# Patient Record
Sex: Female | Born: 1960 | Race: White | Hispanic: No | Marital: Married | Smoking: Never smoker
Health system: Southern US, Community
[De-identification: ages and names within clinical notes are randomized; demographics above are authoritative.]

---

## 1998-03-31 ENCOUNTER — Other Ambulatory Visit: Admission: RE | Admit: 1998-03-31 | Discharge: 1998-03-31 | Payer: Self-pay | Admitting: Obstetrics & Gynecology

## 2005-09-15 ENCOUNTER — Ambulatory Visit: Payer: Self-pay | Admitting: Internal Medicine

## 2005-09-16 ENCOUNTER — Ambulatory Visit: Payer: Self-pay | Admitting: Internal Medicine

## 2005-10-08 ENCOUNTER — Ambulatory Visit: Payer: Self-pay | Admitting: Surgery

## 2006-08-22 IMAGING — US ABDOMEN ULTRASOUND
1 series · 17 of 25 positions shown · non-contrast
Comparison: none

REASON FOR EXAM: Epigastric abdominal pain  CALL REPORT TO [phone]
COMMENTS:

[Series 1: abdomen ultrasound · 17 of 74 slices shown]
[im 1/74]
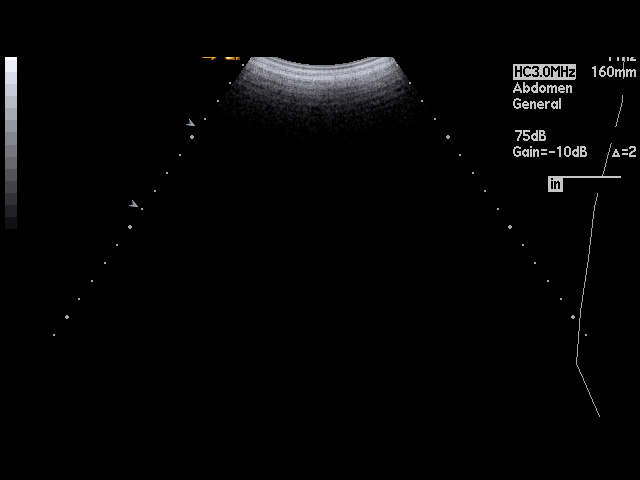
[im 7/74]
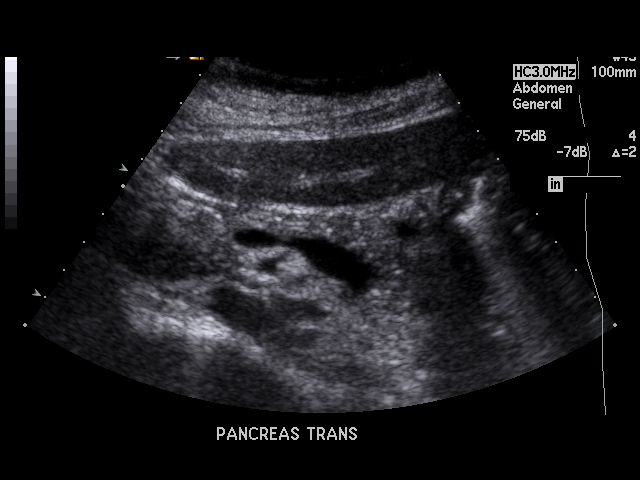
[im 10/74]
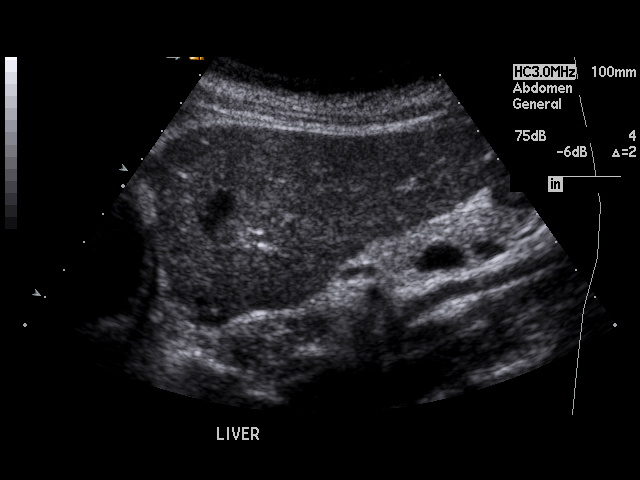
[im 16/74]
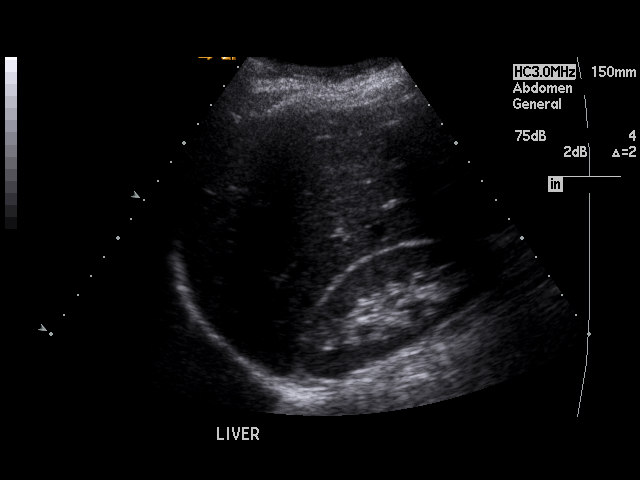
[im 19/74]
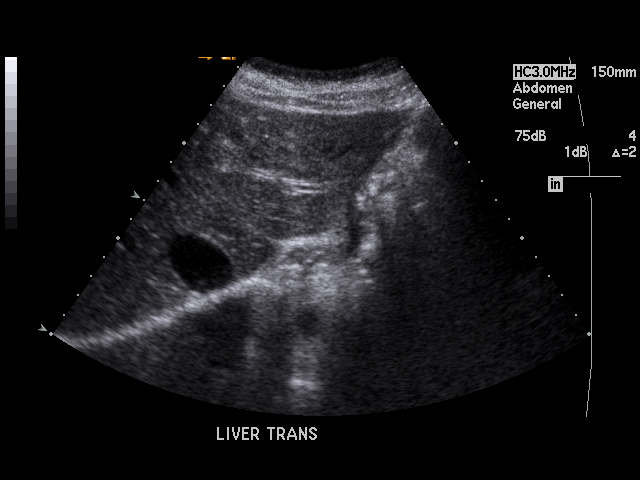
[im 25/74]
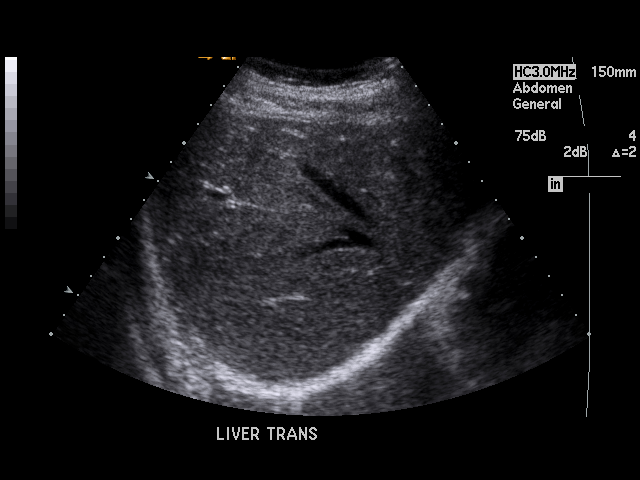
[im 28/74]
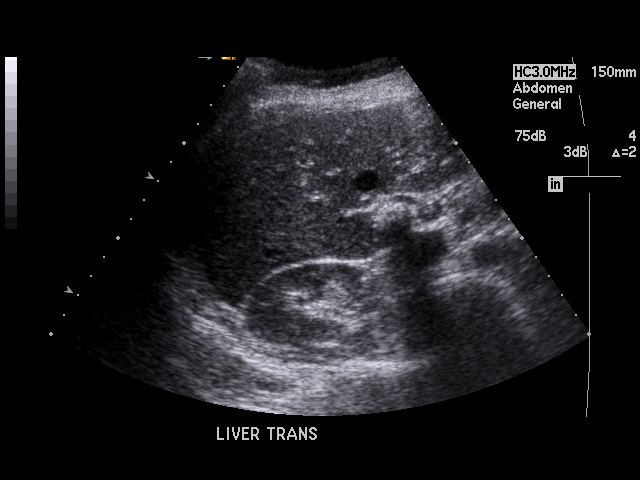
[im 34/74]
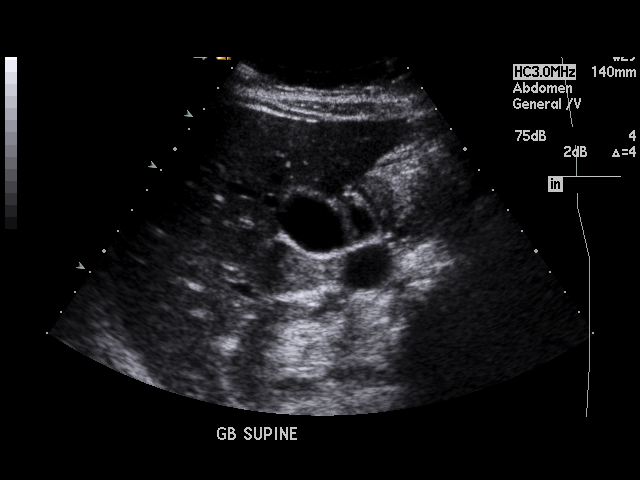
[im 37/74]
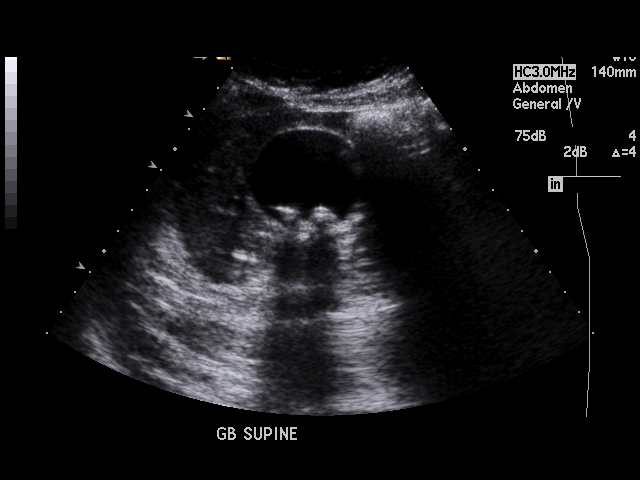
[im 40/74]
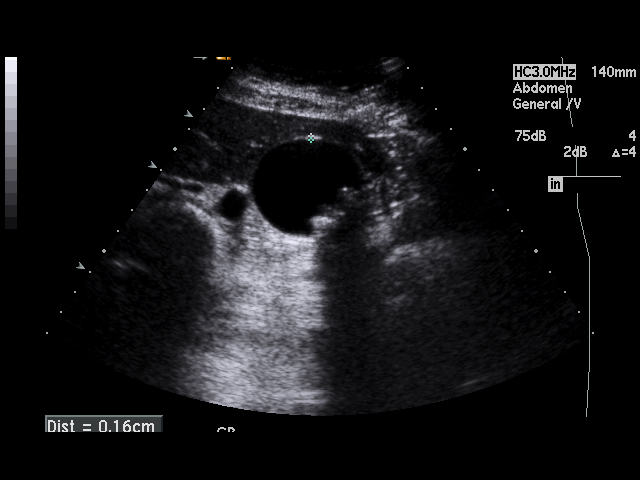
[im 46/74]
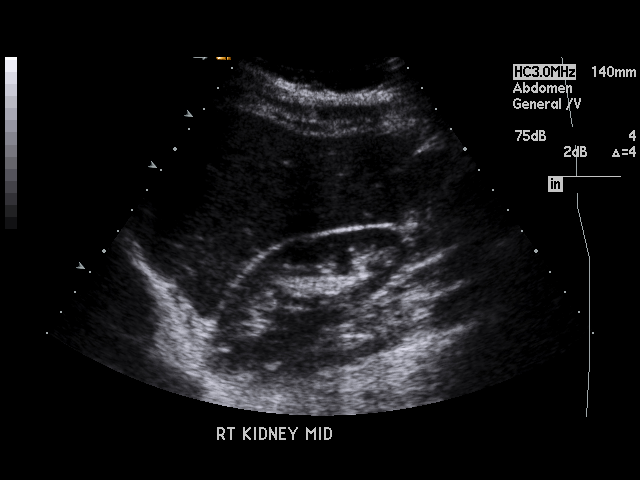
[im 49/74]
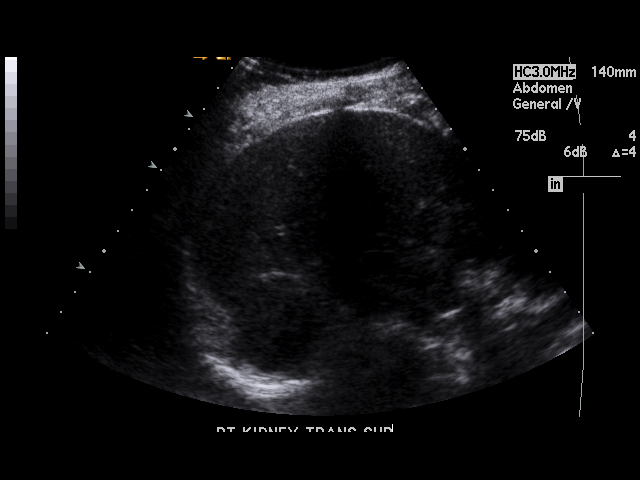
[im 55/74]
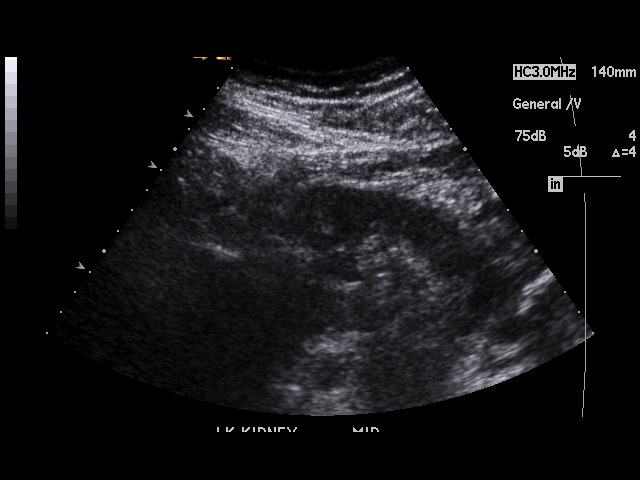
[im 58/74]
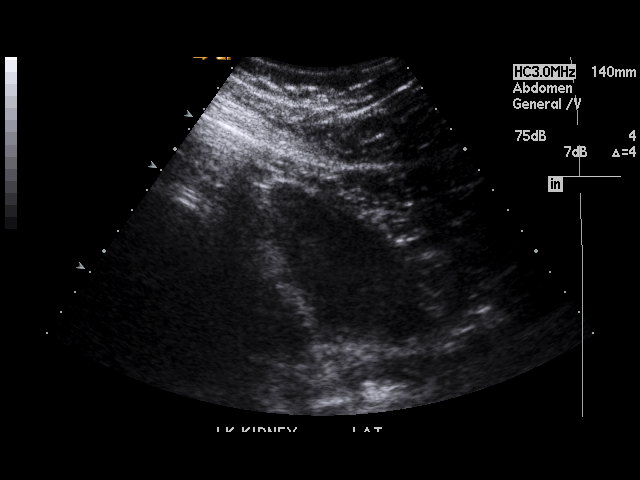
[im 64/74]
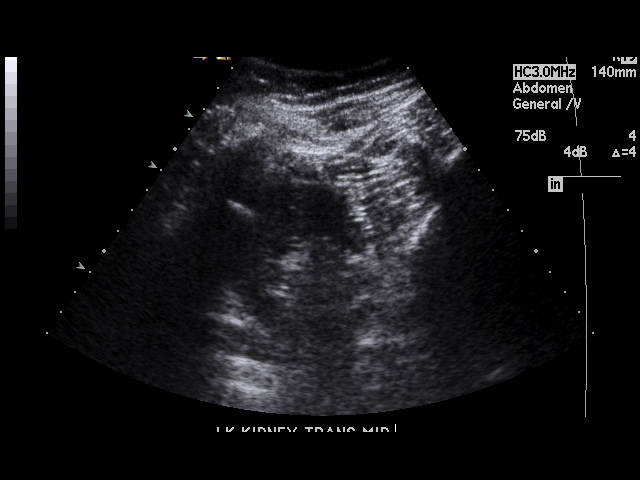
[im 67/74]
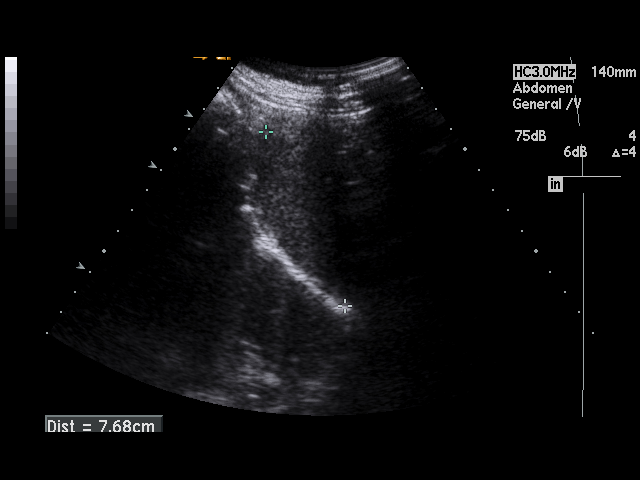
[im 74/74]
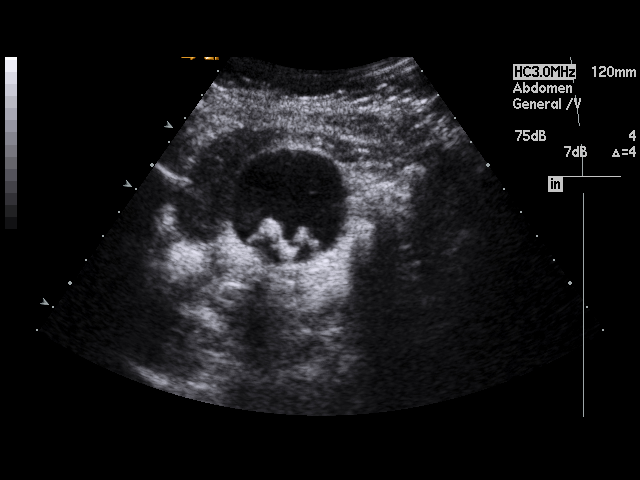

[17 of 25 positions shown; findings below may reference images not displayed]

PROCEDURE:     US  - US ABDOMEN GENERAL SURVEY  - September 16, 2005  [DATE]

RESULT:     The liver, spleen and pancreas are normal in appearance. The
abdominal aorta is visualized and shows no significant abnormalities.  There
are multiple echo densities in the gallbladder compatible with gallstones.
There is no thickening of the gallbladder wall.  The common bile duct
measures 5.7 mm in diameter which is within the range of normal.  The
kidneys show no hydronephrosis.  There is no ascites.
IMPRESSION: Cholelithiasis.

## 2009-07-22 ENCOUNTER — Emergency Department: Payer: Self-pay | Admitting: Emergency Medicine

## 2013-02-27 DIAGNOSIS — Z1211 Encounter for screening for malignant neoplasm of colon: Secondary | ICD-10-CM | POA: Insufficient documentation

## 2017-02-23 ENCOUNTER — Ambulatory Visit: Payer: Self-pay

## 2017-03-22 ENCOUNTER — Ambulatory Visit: Payer: Self-pay | Admitting: Dietician

## 2017-03-22 NOTE — Progress Notes (Signed)
Nutrition: Cassandra Herrera, RD, LDN  03/22/2017  CC: Wants to feel better, have more energy, feel stronger, and lose some weight.  Assessment:    Gives hx or having gained some weight and experienced loss of energy with onset of menopause.  Has done Weight Watchers in past, but finds that at this time it does not work for her.  Currently walks on campus and when shopping will park further from the buildings.  Works out at gym 3-4 times per week.  Combination cardio and resistance.  Diet recall reveals a late breakfast of coffee and yogurt or instant oatmeal with skim Almond milk.  Lunch is usually out, and is 1/2 sandwich and soup or salad.  Dinner is meat, potatoes and green veggie.  Followed with a dessert.  Tries to eat smaller portions, uses chicken and tries to use lower fat desserts.     HT: 62 inches  WT: 157 lbs.  Recommendations:   Try to add cardio to daily routine; 20 minutes daily   Use the interval approach of fast/slow for the short time.    Try to do whole grains.  Look for 3 gm of fiber per serving of breads and cereals.  Consider using the Old fashion oatmeal r/t the instant version.    Consider keeping a food diary for 2 weekdays and 1 weekend day.  This will help to get a handle on just what you are eating.    For fats such as cheese.  Keep the fat grams at 0-9 gm /serving.    Add more protein to evening meal in the form of beans, peas and lentils.    Monitor carb portions (espical potatoes), and desserts.  Short term Goals:    Feel better and have more enery.    Lower Cholesterol.    Get on a healthier track.  Follow-up:  Make appointment for July.

## 2017-04-25 ENCOUNTER — Ambulatory Visit: Payer: Self-pay

## 2017-04-26 ENCOUNTER — Ambulatory Visit: Payer: Self-pay

## 2017-04-26 ENCOUNTER — Ambulatory Visit: Payer: Self-pay | Admitting: Dietician

## 2017-04-26 NOTE — Progress Notes (Signed)
Nutrition F/U visit; Nutrition 05/27/2017  Reports that she has more energy and has been exercising at the gym on 4 out of 7 evenings.  Meets husband and she will do 30 minutes of cardio on the treadmill by walking and running and then follow this with about 30 minutes of resistance/weights.    Eating 3 meals per day.  Limiting bread and other carbs.  Trying to do more whole grains.  Trying to get more vegetables in diet.  Dinner is usually grill chicken or fish and leftovers if available.  Breakfast is usually oatmeal or yogurt and fruit.  Lunch maybe out with husband 3 days per week.  Usually 1/2 sandwich and soup or salad.  Takes one slice of bread off.  Weight today with clothes on is at 155.8.  1.2 lb weight loss.  Feels her clothes fit/feell better.  Pleased with her progress.  Recommendations: Continue with current exercise regimen. Continue to incorporated whole grains and carbs that have more fiber.  Continue to limit fatty meats and frying. Continue to limit bread servings. Watch energy levels.  Plan/Personal Goals: Continue what I'm doing in exercise. Continue healthier eating. Monitor clothes/fit/feel.  F/U:  September.  [facility]

## 2017-11-18 ENCOUNTER — Ambulatory Visit: Payer: Self-pay | Admitting: Adult Health

## 2017-11-18 ENCOUNTER — Encounter: Payer: Self-pay | Admitting: Adult Health

## 2017-11-18 VITALS — BP 117/72 | HR 83 | Temp 98.5°F | Resp 16 | Ht 61.0 in

## 2017-11-18 DIAGNOSIS — J019 Acute sinusitis, unspecified: Secondary | ICD-10-CM

## 2017-11-18 DIAGNOSIS — H1032 Unspecified acute conjunctivitis, left eye: Secondary | ICD-10-CM

## 2017-11-18 MED ORDER — GENTAMICIN SULFATE 0.3 % OP SOLN: 2.0000 [drp] | Freq: Three times a day (TID) | OPHTHALMIC | 0 refills | Status: DC

## 2017-11-18 MED ORDER — GENTAMICIN SULFATE 0.3 % OP SOLN: 2.0000 [drp] | OPHTHALMIC | 0 refills | Status: DC

## 2017-11-18 MED ORDER — AZITHROMYCIN 250 MG PO TABS: ORAL_TABLET | ORAL | 0 refills | Status: DC

## 2017-11-18 NOTE — Addendum Note (Signed)
Addended by: Berniece PapFLINCHUM, Cesilia Shinn S on: 11/18/2017 05:54 PM   Modules accepted: Orders

## 2017-11-18 NOTE — Patient Instructions (Signed)
Bacterial Conjunctivitis Bacterial conjunctivitis is an infection of your conjunctiva. This is the clear membrane that covers the white part of your eye and the inner surface of your eyelid. This condition can make your eye:  Red or pink.  Itchy.  This condition is caused by bacteria. This condition spreads very easily from person to person (is contagious) and from one eye to the other eye. Follow these instructions at home: Medicines  Take or apply your antibiotic medicine as told by your doctor. Do not stop taking or applying the antibiotic even if you start to feel better.  Take or apply over-the-counter and prescription medicines only as told by your doctor.  Do not touch your eyelid with the eye drop bottle or the ointment tube. Managing discomfort  Wipe any fluid from your eye with a warm, wet washcloth or a cotton ball.  Place a cool, clean washcloth on your eye. Do this for 10-20 minutes, 3-4 times per day. General instructions  Do not wear contact lenses until the irritation is gone. Wear glasses until your doctor says it is okay to wear contacts.  Do not wear eye makeup until your symptoms are gone. Throw away any old makeup.  Change or wash your pillowcase every day.  Do not share towels or washcloths with anyone.  Wash your hands often with soap and water. Use paper towels to dry your hands.  Do not touch or rub your eyes.  Do not drive or use heavy machinery if your vision is blurry. Contact a doctor if:  You have a fever.  Your symptoms do not get better after 10 days. Get help right away if:  You have a fever and your symptoms suddenly get worse.  You have very bad pain when you move your eye.  Your face: ? Hurts. ? Is red. ? Is swollen.  You have sudden loss of vision. This information is not intended to replace advice given to you by your health care provider. Make sure you discuss any questions you have with your health care provider. Document  Released: 06/29/2008 Document Revised: 02/26/2016 Document Reviewed: 07/03/2015 Elsevier Interactive Patient Education  2018 [employer]. Allergic Conjunctivitis A clear membrane (conjunctiva) covers the white part of your eye and the inner surface of your eyelid. Allergic conjunctivitis happens when this membrane has inflammation. This is caused by allergies. Common causes of allergic reactions (allergens)include:  Outdoor allergens, such as: ? Pollen. ? Grass and weeds. ? Mold spores.  Indoor allergens, such as: ? Dust. ? Smoke. ? Mold. ? Pet dander. ? Animal hair.  This condition can make your eye red or pink. It can also make your eye feel itchy. This condition cannot be spread from one person to another person (is not contagious). Follow these instructions at home:  Try not to be around things that you are allergic to.  Take or apply over-the-counter and prescription medicines only as told by your doctor. These include any eye drops.  Place a cool, clean washcloth on your eye for 10-20 minutes. Do this 3-4 times a day.  Do not touch or rub your eyes.  Do not wear contact lenses until the inflammation is gone. Wear glasses instead.  Do not wear eye makeup until the inflammation is gone.  Keep all follow-up visits as told by your doctor. This is important. Contact a doctor if:  Your symptoms get worse.  Your symptoms do not get better with treatment.  You have mild eye pain.  You  are sensitive to light,  You have spots or blisters on your eyes.  You have pus coming from your eye.  You have a fever. Get help right away if:  You have redness, swelling, or other symptoms in only one eye.  Your vision is blurry.  You have vision changes.  You have very bad eye pain. Summary  Allergic conjunctivitis is caused by allergies. It can make your eye red or pink, and it can make your eye feel itchy.  This condition cannot be spread from one person to another  person (is not contagious).  Try not to be around things that you are allergic to.  Take or apply over-the-counter and prescription medicines only as told by your doctor. These include any eye drops.  Contact your doctor if your symptoms get worse or they do not get better with treatment. This information is not intended to replace advice given to you by your health care provider. Make sure you discuss any questions you have with your health care provider. Document Released: 03/10/2010 Document Revised: 05/14/2016 Document Reviewed: 05/14/2016 Elsevier Interactive Patient Education  2017 [employer]

## 2017-11-18 NOTE — Progress Notes (Addendum)
Patient ID: Cassandra Herrera, female   DOB: May 10, 1961, 57 y.o.   MRN: [id number] 11/18/17  Patient called to say z-pack was received  by pharmacy but they did not receive eye drops. Prpovider resent as above and discontinued 1st prescription. She had called [name] to inform after hours and [name] will notify patinet it has been resent   Meds ordered this encounter  Medications  . azithromycin (ZITHROMAX) 250 MG tablet    Sig: Take 2 tablets ( 500 mg) day one, then take 1 tablet (250mg ) days 2,3,4,5    Dispense:  6 tablet    Refill:  0  .     .     . gentamicin (GARAMYCIN) 0.3 % ophthalmic solution    Sig: Place 2 drops into the left eye 3 (three) times daily. For 5 to 7 days    Dispense:  5 mL    Refill:  0  Strike through not ordered/ Gentamycin ophthalmic  In red ordered as directly above.   [facility] as was on hold for 15 minutes without response.

## 2017-11-18 NOTE — Progress Notes (Addendum)
Subjective:     Patient ID: Cassandra Herrera, female   DOB: 1960/11/16, 57 y.o.   MRN: [id number]  Conjunctivitis   The current episode started 2 days ago. The onset was gradual. The problem occurs continuously. The problem has been unchanged. The problem is mild. Nothing relieves the symptoms. Associated symptoms include eye itching (mild ), congestion, rhinorrhea, eye discharge (left eye yellow discharge ) and eye redness. Pertinent negatives include no orthopnea, no fever, no decreased vision, no double vision, no photophobia, no abdominal pain, no constipation, no diarrhea, no nausea, no vomiting, no ear discharge, no ear pain, no headaches, no hearing loss, no mouth sores, no sore throat, no stridor, no swollen glands, no muscle aches, no neck pain, no neck stiffness, no cough, no URI, no wheezing, no rash and no eye pain. Severity: no pain    Patient refused weight.  She denies any eye injury, eye pain or any visual disturbance. Denies any fever. Denies any contact with foreign body. Denies contact lens use.  She alsop reports nasal drainage and sinus pressure left maxillary. Denies any other symptoms.   Patient  denies any fever, chills, rash, chest pain, shortness of breath, nausea, vomiting, or diarrhea.   Vision exam is 20/30 bilateral eyes without glasses.   Blood pressure 117/72, pulse 83, temperature 98.5 F (36.9 C), resp. rate 16, height 5\' 1"  (1.549 m), SpO2 100 %.  There are no active problems to display for this patient.  No current outpatient medications on file.\ Patient denies any medication/ or problems.   Review of Systems  Constitutional: Negative for activity change, appetite change, chills, diaphoresis, fatigue, fever and unexpected weight change.  HENT: Positive for congestion, postnasal drip, rhinorrhea and sinus pressure (left maxillary ). Negative for dental problem, drooling, ear discharge, ear pain, facial swelling, hearing loss, mouth sores, nosebleeds, sinus  pain, sneezing, sore throat, tinnitus, trouble swallowing and voice change.   Eyes: Positive for discharge (left eye yellow discharge ), redness and itching (mild ). Negative for double vision, photophobia and pain.  Respiratory: Negative for apnea, cough, choking, chest tightness, shortness of breath, wheezing and stridor.   Cardiovascular: Negative.  Negative for chest pain, palpitations, orthopnea and leg swelling.  Gastrointestinal: Negative.  Negative for abdominal distention, abdominal pain, anal bleeding, blood in stool, constipation, diarrhea, nausea, rectal pain and vomiting.  Genitourinary: Negative.   Musculoskeletal: Negative.  Negative for neck pain.  Skin: Negative.  Negative for rash.  Neurological: Negative.  Negative for headaches.  Hematological: Negative.   Psychiatric/Behavioral: Negative.        Objective:   Physical Exam  Constitutional: She is oriented to person, place, and time. She appears well-developed and well-nourished. No distress.  HENT:  Head: Normocephalic and atraumatic.  Right Ear: Hearing, external ear and ear canal normal. Tympanic membrane is not perforated, not erythematous and not retracted. A middle ear effusion is present.  Left Ear: Hearing, external ear and ear canal normal. Tympanic membrane is not perforated, not erythematous and not retracted. A middle ear effusion is present.  Nose: Mucosal edema and rhinorrhea present. Right sinus exhibits no maxillary sinus tenderness and no frontal sinus tenderness. Left sinus exhibits maxillary sinus tenderness. Left sinus exhibits no frontal sinus tenderness.  Mouth/Throat: Uvula is midline. Posterior oropharyngeal erythema (mild ) present. No oropharyngeal exudate, posterior oropharyngeal edema or tonsillar abscesses.  Eyes: EOM are normal. Pupils are equal, round, and reactive to light. Right eye exhibits no chemosis, no discharge, no exudate and no hordeolum.  No foreign body present in the right eye. Left  eye exhibits discharge (yellow discharge on lower lid and upper eyelid/ mild generalized pinkness to conjunctiva. NO pain. NO visual disturbances. ). Left eye exhibits no chemosis, no exudate and no hordeolum. No foreign body present in the left eye. Right conjunctiva is not injected. Right conjunctiva has no hemorrhage. Left conjunctiva is injected (mild pink discoloration to left conjunctiva ). Left conjunctiva has no hemorrhage. No scleral icterus. Right eye exhibits normal extraocular motion and no nystagmus. Left eye exhibits normal extraocular motion and no nystagmus. Right pupil is round and reactive. Left pupil is round and reactive. Pupils are equal.  No pain or boney tenderness around periorbital. No erythema of skin.   Neck: Normal range of motion. Neck supple. No JVD present. No tracheal deviation present.  Cardiovascular: Normal rate, regular rhythm, normal heart sounds and intact distal pulses. Exam reveals no gallop and no friction rub.  No murmur heard. Pulmonary/Chest: Effort normal and breath sounds normal. No stridor. No respiratory distress. She has no wheezes. She has no rales. She exhibits no tenderness.  Abdominal: Soft. Bowel sounds are normal.  Musculoskeletal: Normal range of motion.  Lymphadenopathy:    She has no cervical adenopathy.  Neurological: She is alert and oriented to person, place, and time. She displays normal reflexes. No cranial nerve deficit. She exhibits normal muscle tone. Coordination normal.  Patient moves on and off of exam table and in room without difficulty. Gait is normal in hall and in room. Patient is oriented to person place time and situation. Patient answers questions appropriately and engages in conversation.   Skin: Skin is warm and dry. No rash noted. She is not diaphoretic. No erythema. No pallor.  Psychiatric: She has a normal mood and affect. Her behavior is normal. Judgment and thought content normal.  Vitals reviewed.      Assessment:     Acute conjunctivitis of left eye, unspecified acute conjunctivitis type  Acute non-recurrent sinusitis, unspecified location       Plan:     Meds ordered this encounter  Medications  . azithromycin (ZITHROMAX) 250 MG tablet    Sig: Take 2 tablets ( 500 mg) day one, then take 1 tablet (250mg ) days 2,3,4,5    Dispense:  6 tablet    Refill:  0  . gentamicin (GARAMYCIN) 0.3 % ophthalmic solution    Sig: Place 2 drops into the left eye 3 (three) times daily for 5 days.    Dispense:  5 mL    Refill:  0    She is educated that if any eye pain, increased eye redness, visual disturbances or any worsening symptoms to be seen in urgent care/ Emergency room. She is advised no symptoms should get worse.  Also advised to start Claritin 10 mg daily to rule out allergy component.   Advised to return to clinic for an appointment if no improvement within 72 hours or if any symptoms change or worsen. Advised ER or urgent Care if after hours or on weekend. 911 for emergency symptoms at any time.   Patient verbalized understanding of all instructions given and denies any further questions at this time.

## 2018-07-26 ENCOUNTER — Other Ambulatory Visit: Payer: Self-pay

## 2018-07-26 DIAGNOSIS — Z130 Encounter for screening for diseases of the blood and blood-forming organs and certain disorders involving the immune mechanism: Secondary | ICD-10-CM

## 2018-07-27 ENCOUNTER — Other Ambulatory Visit: Payer: Self-pay

## 2018-09-21 ENCOUNTER — Encounter: Payer: Self-pay | Admitting: Adult Health

## 2018-09-21 ENCOUNTER — Ambulatory Visit: Payer: Self-pay | Admitting: Adult Health

## 2018-09-21 VITALS — BP 130/70 | HR 71 | Temp 98.4°F | Resp 16

## 2018-09-21 DIAGNOSIS — J039 Acute tonsillitis, unspecified: Principal | ICD-10-CM

## 2018-09-21 DIAGNOSIS — J029 Acute pharyngitis, unspecified: Secondary | ICD-10-CM | POA: Insufficient documentation

## 2018-09-21 MED ORDER — CLINDAMYCIN HCL 150 MG PO CAPS: 150.0000 mg | ORAL_CAPSULE | Freq: Three times a day (TID) | ORAL | 0 refills | Status: AC

## 2018-09-21 NOTE — Progress Notes (Signed)
Subjective:     Patient ID: Cassandra Herrera, female   DOB: [birth year], 57 y.o.   MRN: [id number]  HPI  Blood pressure 130/70, pulse 71, temperature 98.4 F (36.9 C), resp. rate 16, SpO2 100 %.  Patient is a 57 year old female in no acute distress who comes to the clinic with complaint of on tonsil on right side.  Onset on 09/19/18.  Patient  denies any fever, body aches,chills, rash, chest pain, shortness of breath, nausea, vomiting, or diarrhea.        No LMP recorded. Menopause.   Allergies  Allergen Reactions  . Penicillins Hives    Patient Active Problem List   Diagnosis Date Noted  . Colon cancer screening 02/27/2013    No current outpatient medications on file.   Review of Systems  Constitutional: Positive for fatigue. Negative for activity change, appetite change, chills, diaphoresis, fever and unexpected weight change.  HENT: Positive for sore throat. Negative for congestion, dental problem, drooling, ear discharge, ear pain (right ear pressure. ), facial swelling, hearing loss, mouth sores, nosebleeds, postnasal drip, rhinorrhea, sinus pressure, sinus pain, sneezing, tinnitus, trouble swallowing and voice change.   Respiratory: Negative.   Cardiovascular: Negative.   Gastrointestinal: Negative.   Genitourinary: Negative.   Musculoskeletal: Negative.   Skin: Negative.   Allergic/Immunologic:        -- Penicillins -- Hives   Neurological: Negative.   Hematological: Negative.   Psychiatric/Behavioral: Negative.        Objective:   Physical Exam Vitals signs reviewed.  Constitutional:      General: She is not in acute distress.    Appearance: She is well-developed. She is not ill-appearing, toxic-appearing or diaphoretic.  HENT:     Head: Normocephalic and atraumatic.     Jaw: There is normal jaw occlusion.     Salivary Glands: Right salivary gland is not diffusely enlarged or tender. Left salivary gland is not diffusely enlarged or tender.     Right  Ear: Tympanic membrane, ear canal and external ear normal. No drainage, swelling or tenderness. No middle ear effusion. Tympanic membrane is not erythematous.     Left Ear: Tympanic membrane, ear canal and external ear normal. No drainage, swelling or tenderness.  No middle ear effusion. Tympanic membrane is not erythematous.     Nose: Nose normal. No congestion or rhinorrhea.     Right Sinus: No maxillary sinus tenderness or frontal sinus tenderness.     Left Sinus: No maxillary sinus tenderness or frontal sinus tenderness.     Mouth/Throat:     Mouth: Mucous membranes are moist. Mucous membranes are pale. No oral lesions.     Pharynx: Uvula midline. Oropharyngeal exudate and posterior oropharyngeal erythema present. No pharyngeal swelling or uvula swelling.     Tonsils: Tonsillar exudate (white ) present. No tonsillar abscesses. Swelling: 1+ on the right. 1+ on the left.   Eyes:     General: Lids are normal.     Extraocular Movements:     Right eye: Normal extraocular motion.     Left eye: Normal extraocular motion.     Conjunctiva/sclera: Conjunctivae normal.     Pupils: Pupils are equal, round, and reactive to light.  Neck:     Musculoskeletal: Normal range of motion and neck supple.     Trachea: Trachea and phonation normal.     Meningeal: Brudzinski's sign absent.  Cardiovascular:     Rate and Rhythm: Normal rate and regular rhythm.  Heart sounds: Normal heart sounds. No murmur. No friction rub. No gallop.   Pulmonary:     Effort: Pulmonary effort is normal.     Breath sounds: Normal breath sounds.  Abdominal:     Palpations: Abdomen is soft.  Lymphadenopathy:     Head:     Right side of head: No submental, submandibular, tonsillar, preauricular, posterior auricular or occipital adenopathy.     Left side of head: No submental, submandibular, tonsillar, preauricular, posterior auricular or occipital adenopathy.     Cervical: No cervical adenopathy.  Skin:    General: Skin  is warm and dry.     Capillary Refill: Capillary refill takes less than 2 seconds.     Nails: There is no clubbing.   Neurological:     Mental Status: She is alert.  Psychiatric:        Attention and Perception: Attention normal.        Mood and Affect: Mood normal.        Speech: Speech normal.        Behavior: Behavior normal. Behavior is cooperative.        Thought Content: Thought content normal.        Cognition and Memory: Cognition normal.        Judgment: Judgment normal.        Assessment:       Tonsillopharyngitis   Plan:   Meds ordered this encounter  Medications  . clindamycin (CLEOCIN) 150 MG capsule    Sig: Take 1 capsule (150 mg total) by mouth 3 (three) times daily for 10 days.    Dispense:  30 capsule    Refill:  0    Gave and reviewed After Visit Summary(AVS) with patient.  Seek medical care as educated below.     Advised patient call the office or your primary care doctor for an appointment if no improvement within 72 hours or if any symptoms change or worsen at any time  Advised ER or urgent Care if after hours or on weekend. Call 911 for emergency symptoms at any time.Patinet verbalized understanding of all instructions given/reviewed and treatment plan and has no further questions or concerns at this time.    Patient verbalized understanding of all instructions given and denies any further questions at this time.

## 2018-09-21 NOTE — Patient Instructions (Signed)
Clindamycin capsules What is this medicine? CLINDAMYCIN (Hobson sin) is a lincosamide antibiotic. It is used to treat certain kinds of bacterial infections. It will not work for colds, flu, or other viral infections. This medicine may be used for other purposes; ask your health care provider or pharmacist if you have questions. COMMON BRAND NAME(S): Cleocin What should I tell my health care provider before I take this medicine? They need to know if you have any of these conditions: -kidney disease -liver disease -stomach problems like colitis -an unusual or allergic reaction to clindamycin, lincomycin, or other medicines, foods, dyes like tartrazine or preservatives -pregnant or trying to get pregnant -breast-feeding How should I use this medicine? Take this medicine by mouth with a full glass of water. Follow the directions on the prescription label. You can take this medicine with food or on an empty stomach. If the medicine upsets your stomach, take it with food. Take your medicine at regular intervals. Do not take your medicine more often than directed. Take all of your medicine as directed even if you think your are better. Do not skip doses or stop your medicine early. Talk to your pediatrician regarding the use of this medicine in children. Special care may be needed. Overdosage: If you think you have taken too much of this medicine contact a poison control center or emergency room at once. NOTE: This medicine is only for you. Do not share this medicine with others. What if I miss a dose? If you miss a dose, take it as soon as you can. If it is almost time for your next dose, take only that dose. Do not take double or extra doses. What may interact with this medicine? -birth control pills -erythromycin -medicines that relax muscles for surgery -rifampin This list may not describe all possible interactions. Give your health care provider a list of all the medicines, herbs,  non-prescription drugs, or dietary supplements you use. Also tell them if you smoke, drink alcohol, or use illegal drugs. Some items may interact with your medicine. What should I watch for while using this medicine? Tell your doctor or healthcare professional if your symptoms do not start to get better or if they get worse. Do not treat diarrhea with over the counter products. Contact your doctor if you have diarrhea that lasts more than 2 days or if it is severe and watery. What side effects may I notice from receiving this medicine? Side effects that you should report to your doctor or health care professional as soon as possible: -allergic reactions like skin rash, itching or hives, swelling of the face, lips, or tongue -dark urine -pain on swallowing -redness, blistering, peeling or loosening of the skin, including inside the mouth -unusual bleeding or bruising -unusually weak or tired -yellowing of eyes or skin Side effects that usually do not require medical attention (report to your doctor or health care professional if they continue or are bothersome): -diarrhea -itching in the rectal or genital area -joint pain -nausea, vomiting -stomach pain This list may not describe all possible side effects. Call your doctor for medical advice about side effects. You may report side effects to FDA at 1-800-FDA-1088. Where should I keep my medicine? Keep out of the reach of children. Store at room temperature between 20 and 25 degrees C (68 and 77 degrees F). Throw away any unused medicine after the expiration date. NOTE: This sheet is a summary. It may not cover all possible information. If you  have questions about this medicine, talk to your doctor, pharmacist, or health care provider.  2019 Elsevier/Gold Standard (2015-12-24 16:34:00) Pharyngitis  Pharyngitis is a sore throat (pharynx). This is when there is redness, pain, and swelling in your throat. Most of the time, this condition gets  better on its own. In some cases, you may need medicine. Follow these instructions at home:  Take over-the-counter and prescription medicines only as told by your doctor. ? If you were prescribed an antibiotic medicine, take it as told by your doctor. Do not stop taking the antibiotic even if you start to feel better. ? Do not give children aspirin. Aspirin has been linked to Reye syndrome.  Drink enough water and fluids to keep your pee (urine) clear or pale yellow.  Get a lot of rest.  Rinse your mouth (gargle) with a salt-water mixture 3-4 times a day or as needed. To make a salt-water mixture, completely dissolve -1 tsp of salt in 1 cup of warm water.  If your doctor approves, you may use throat lozenges or sprays to soothe your throat. Contact a doctor if:  You have large, tender lumps in your neck.  You have a rash.  You cough up green, yellow-brown, or bloody spit. Get help right away if:  You have a stiff neck.  You drool or cannot swallow liquids.  You cannot drink or take medicines without throwing up.  You have very bad pain that does not go away with medicine.  You have problems breathing, and it is not from a stuffy nose.  You have new pain and swelling in your knees, ankles, wrists, or elbows. Summary  Pharyngitis is a sore throat (pharynx). This is when there is redness, pain, and swelling in your throat.  If you were prescribed an antibiotic medicine, take it as told by your doctor. Do not stop taking the antibiotic even if you start to feel better.  Most of the time, pharyngitis gets better on its own. Sometimes, you may need medicine. This information is not intended to replace advice given to you by your health care provider. Make sure you discuss any questions you have with your health care provider. Document Released: 03/08/2008 Document Revised: 10/26/2016 Document Reviewed: 10/26/2016 Elsevier Interactive Patient Education  2019 [employer]

## 2019-07-18 ENCOUNTER — Ambulatory Visit: Payer: Self-pay

## 2019-07-18 ENCOUNTER — Other Ambulatory Visit: Payer: Self-pay

## 2019-07-18 DIAGNOSIS — Z23 Encounter for immunization: Secondary | ICD-10-CM

## 2019-07-30 ENCOUNTER — Other Ambulatory Visit: Payer: Self-pay

## 2019-07-30 DIAGNOSIS — Z20822 Contact with and (suspected) exposure to covid-19: Secondary | ICD-10-CM

## 2019-08-01 LAB — NOVEL CORONAVIRUS, NAA: SARS-CoV-2, NAA: NOT DETECTED

## 2020-10-27 ENCOUNTER — Encounter: Payer: BC Managed Care – PPO | Admitting: Dermatology

## 2021-02-17 ENCOUNTER — Other Ambulatory Visit: Payer: Self-pay

## 2021-02-17 ENCOUNTER — Telehealth: Payer: BC Managed Care – PPO | Admitting: Medical

## 2021-02-17 ENCOUNTER — Ambulatory Visit: Payer: BC Managed Care – PPO

## 2021-02-17 DIAGNOSIS — R509 Fever, unspecified: Secondary | ICD-10-CM

## 2021-02-17 DIAGNOSIS — R6889 Other general symptoms and signs: Secondary | ICD-10-CM

## 2021-02-17 DIAGNOSIS — R059 Cough, unspecified: Secondary | ICD-10-CM

## 2021-02-17 DIAGNOSIS — Z20822 Contact with and (suspected) exposure to covid-19: Secondary | ICD-10-CM

## 2021-02-17 DIAGNOSIS — J3489 Other specified disorders of nose and nasal sinuses: Secondary | ICD-10-CM

## 2021-02-17 LAB — POC COVID19 BINAXNOW: SARS Coronavirus 2 Ag: NEGATIVE

## 2021-02-17 LAB — POCT INFLUENZA A/B
Influenza A, POC: NEGATIVE
Influenza B, POC: NEGATIVE

## 2021-02-17 NOTE — Progress Notes (Signed)
   Subjective:    Patient ID: Cassandra Herrera, female    DOB: [birth year], 60 y.o.   MRN: [id number]  HPI 60 yo female in non acute distress consents to telemedicine appointment.Suday night with coughing with sym[ptoms wsorsening, congestion, fever, Denies loss of taste or smell. Using OTC Delsym and Tylenol for symptom control. Slept well last night.  O2 96%  Review of Systems  Constitutional: Positive for appetite change (decreased food , but drinking), chills, fatigue and fever.  HENT: Positive for congestion, postnasal drip and rhinorrhea (clear). Negative for sinus pressure, sinus pain, sneezing and sore throat.   Respiratory: Positive for cough (clear) and chest tightness. Negative for shortness of breath.   Cardiovascular: Negative for chest pain.  Gastrointestinal: Negative for abdominal pain and diarrhea.       Stomach bug last week x 2 days, with diarrhea, no N/V  Musculoskeletal: Negative for myalgias.  Skin: Negative for color change and rash.  Allergic/Immunologic: Negative for environmental allergies and food allergies.  Neurological: Negative for dizziness, syncope, light-headedness and headaches.       Objective:   Physical Exam AXOX3 No distress noted No physical exam performed due to telemedicine appointment.    A cough noted on call Results for orders placed or performed in visit on 02/17/21 (from the past 24 hour(s))  POC COVID-19     Status: Normal   Collection Time: 02/17/21  9:06 AM  Result Value Ref Range   SARS Coronavirus 2 Ag Negative Negative  POCT Influenza A/B     Status: Normal   Collection Time: 02/17/21  9:07 AM  Result Value Ref Range   Influenza A, POC Negative Negative   Influenza B, POC Negative Negative      Assessment & Plan:  Covid-19 screening. Cough Nasal congestion and rhinorrhea Isolate, rest, increase fluids, OTC Motrin or tylenol if not feeling well or for fever per package instructions. Covid-19 PCR Pending will treat as  positive due to symptoms. RTW date is  02/23/21. If worsening patient to call the clinic . She verbalizes understanding and has no questions at discharge.

## 2021-02-17 NOTE — Patient Instructions (Signed)

## 2021-02-18 LAB — NOVEL CORONAVIRUS, NAA: SARS-CoV-2, NAA: DETECTED — AB

## 2021-02-18 LAB — SARS-COV-2, NAA 2 DAY TAT

## 2021-02-19 ENCOUNTER — Telehealth: Payer: BC Managed Care – PPO | Admitting: Nurse Practitioner

## 2021-02-19 ENCOUNTER — Other Ambulatory Visit: Payer: Self-pay

## 2021-02-19 DIAGNOSIS — U071 COVID-19: Secondary | ICD-10-CM

## 2021-02-19 NOTE — Progress Notes (Signed)
Patient was seen on office on 02/17/21 tested negative for COVID-19 on rapid  Today PCR returned positive   After visit at [facility] she was seen at her PCPs office where she tested negative for flu as well  She was started on ZPack and Predisone at that time.   Patient is improving today  Slight productive cough that is improving  She has been running a fever but that stopped as of yesterday.   She has been vaccinated without a booster for COVID-19   She denies a history of HTN asthma or respiratory disease.   Advised patient that quarantine will extend through today, then she will be able to return to activities with a mask for 5 additional days. Wednesday of next week 02/25/21 she may return to activities without a mask  She will also reach out to PCP to discuss possible antivirals  And will f/u if needed if symptoms persist or with new concerns  Recent Results (from the past 2160 hour(s))  Novel Coronavirus, NAA ([facility])     Status: Abnormal   Collection Time: 02/17/21  8:30 AM   Specimen: Nasopharyngeal(NP) swabs in vial transport medium   Nasopharynge  Result Value Ref Range   SARS-CoV-2, NAA Detected (A) Not Detected    Comment: Patients who have a positive COVID-19 test result may now have treatment options. Treatment options are available for patients with mild to moderate symptoms and for hospitalized patients. Visit our website at [email or web address] for resources and information. This nucleic acid amplification test was developed and its performance characteristics determined by [employer]. Nucleic acid amplification tests include RT-PCR and TMA. This test has not been FDA cleared or approved. This test has been authorized by FDA under an Emergency Use Authorization (EUA). This test is only authorized for the duration of time the declaration that circumstances exist justifying the authorization of the emergency use of in vitro diagnostic tests for  detection of SARS-CoV-2 virus and/or diagnosis of COVID-19 infection under section 564(b)(1) of the Act, 21 U.S.C. 818HUD-1(S) (1), unless the authorization is terminated or revoked sooner. When diagnostic testing is negativ e, the possibility of a false negative result should be considered in the context of a patient's recent exposures and the presence of clinical signs and symptoms consistent with COVID-19. An individual without symptoms of COVID-19 and who is not shedding SARS-CoV-2 virus would expect to have a negative (not detected) result in this assay.   SARS-COV-2, NAA 2 DAY TAT     Status: None   Collection Time: 02/17/21  8:30 AM   Nasopharynge  Result Value Ref Range   SARS-CoV-2, NAA 2 DAY TAT Performed   POC COVID-19     Status: Normal   Collection Time: 02/17/21  9:06 AM  Result Value Ref Range   SARS Coronavirus 2 Ag Negative Negative    Comment: Patient aware of negative result. Virtual f/u with H Ratcliffe  POCT Influenza A/B     Status: Normal   Collection Time: 02/17/21  9:07 AM  Result Value Ref Range   Influenza A, POC Negative Negative   Influenza B, POC Negative Negative

## 2021-06-01 ENCOUNTER — Ambulatory Visit: Payer: BC Managed Care – PPO | Admitting: Medical

## 2021-06-01 ENCOUNTER — Other Ambulatory Visit: Payer: Self-pay

## 2021-06-01 DIAGNOSIS — R059 Cough, unspecified: Secondary | ICD-10-CM

## 2021-06-01 NOTE — Progress Notes (Signed)
   Subjective:    Patient ID: Cassandra Herrera, female    DOB: [birth year], 60 y.o.   MRN: [id number]  HPI 60 yo female in non acute distress consents to telemedicine appointment. Sick on Thursday and Friday with cough  and cold like symptoms. Covid-19 at home was negative at this point she had 2-3 days of symptoms. Nasal congestions. Still coughing on occasion it is light yellow.   Allergies  Allergen Reactions   Penicillins Hives     Review of Systems  Constitutional:  Negative for chills and fever.  HENT:  Positive for congestion and rhinorrhea (clear).   Respiratory:  Positive for cough (on occasion light yellow productive) and wheezing (friday and saturday better today). Negative for shortness of breath.   Cardiovascular:  Negative for chest pain.  Gastrointestinal:  Negative for abdominal pain and nausea.  Musculoskeletal:  Negative for back pain.      Objective:   Physical Exam  No physical exam performed. Clearing throat on phone call      Assessment & Plan:  Cough She will try her PCP if unable to see him she will call back. She verbalizes understanding and has no questions at the end of our coversation.

## 2021-06-01 NOTE — Patient Instructions (Signed)
Murray &amp; Nadel's Textbook of Respiratory Medicine (7th ed., pp. 508-520). Elsevier.">  Postnasal Drip Postnasal drip is the feeling of mucus going down the back of your throat. Mucus is a slimy substance that moistens and cleans your nose and throat, as well as the air pockets in face bones near your forehead and cheeks (sinuses). Small amounts of mucus pass from your nose and sinuses down the back of your throat all the time. This is normal. When you produce too much mucus or themucus gets too thick, you can feel it. Some common causes of postnasal drip include: Having more mucus because of: A cold or the flu. Allergies. Cold air. Certain medicines. Having more mucus that is thicker because of: A sinus or nasal infection. Dry air. A food allergy. Follow these instructions at home: Relieving discomfort  Gargle with a salt-water mixture 3-4 times a day or as needed. To make a salt-water mixture, completely dissolve -1 tsp of salt in 1 cup of warm water. If the air in your home is dry, use a humidifier to add moisture to the air. Use a saline spray or container (neti pot) to flush out the nose (nasal irrigation). These methods can help clear away mucus and keep the nasal passages moist.  General instructions Take over-the-counter and prescription medicines only as told by your health care provider. Follow instructions from your health care provider about eating or drinking restrictions. You may need to avoid caffeine. Avoid things that you know you are allergic to (allergens), like dust, mold, pollen, pets, or certain foods. Drink enough fluid to keep your urine pale yellow. Keep all follow-up visits as told by your health care provider. This is important. Contact a health care provider if: You have a fever. You have a sore throat. You have difficulty swallowing. You have headache. You have sinus pain. You have a cough that does not go away. The mucus from your nose becomes thick  and is green or yellow in color. You have cold or flu symptoms that last more than 10 days. Summary Postnasal drip is the feeling of mucus going down the back of your throat. If your health care provider approves, use nasal irrigation or a nasal spray 2?4 times a day. Avoid things that you know you are allergic to (allergens), like dust, mold, pollen, pets, or certain foods. This information is not intended to replace advice given to you by your health care provider. Make sure you discuss any questions you have with your healthcare provider. Document Revised: 07/01/2020 Document Reviewed: 07/01/2020 Elsevier Patient Education  2022 Elsevier Inc.  

## 2021-07-22 ENCOUNTER — Ambulatory Visit: Payer: BC Managed Care – PPO

## 2022-09-20 ENCOUNTER — Ambulatory Visit
Admission: EM | Admit: 2022-09-20 | Discharge: 2022-09-20 | Disposition: A | Discharge status: Home / Self Care | Payer: 59 | Attending: Urgent Care | Admitting: Urgent Care

## 2022-09-20 DIAGNOSIS — R059 Cough, unspecified: Secondary | ICD-10-CM | POA: Insufficient documentation

## 2022-09-20 DIAGNOSIS — R509 Fever, unspecified: Secondary | ICD-10-CM | POA: Diagnosis not present

## 2022-09-20 DIAGNOSIS — Z1152 Encounter for screening for COVID-19: Secondary | ICD-10-CM | POA: Insufficient documentation

## 2022-09-20 DIAGNOSIS — R6889 Other general symptoms and signs: Secondary | ICD-10-CM | POA: Diagnosis not present

## 2022-09-20 LAB — RESP PANEL BY RT-PCR (FLU A&B, COVID) ARPGX2
Influenza A by PCR: POSITIVE — AB
Influenza B by PCR: NEGATIVE
SARS Coronavirus 2 by RT PCR: NEGATIVE

## 2022-09-20 MED ORDER — BENZONATATE 100 MG PO CAPS: ORAL_CAPSULE | ORAL | 0 refills | Status: AC

## 2022-09-20 NOTE — Discharge Instructions (Addendum)
You have been diagnosed with a viral upper respiratory infection based on your symptoms and exam. Viral illnesses cannot be treated with antibiotics - they are self limiting - and you should find your symptoms resolving within a few days. Get plenty of rest and non-caffeinated fluids.  We have performed a respiratory swab checking for COVID, and influenza.  If the results of this testing are positive, someone will call you if you are eligible for any antiviral treatment.    We recommend you use over-the-counter medications for symptom control including Tylenol or ibuprofen for fever, chills or body aches, and cold/cough medication.  Saline mist spray is helpful for removing excess mucus from your nose.  Room humidifiers are helpful to ease breathing at night. You might also find relief of nasal/sinus congestion symptoms by using a nasal decongestant such as Sudafed sinus (pseudoephedrine).  You will need to obtain this medication from behind the pharmacist counter.  Speak to the pharmacist to verify that you are not duplicating medications with other over-the-counter formulations that you may be using.   Follow up here or with your primary care provider if your symptoms are worsening or not improving.    

## 2022-09-20 NOTE — ED Provider Notes (Signed)
Renal[facility] Fiddler    CSN: [id number] Arrival date & time: 09/20/22  1328      History   Chief Complaint Chief Complaint  Patient presents with   Cough   Chills   Fever    HPI Cassandra Herrera is a 61 y.o. female.    Cough Associated symptoms: fever   Fever Associated symptoms: cough     Presents to urgent care with complaint of cough, fever, chills x 3 days.  Presents with elevated heart rate of 104 bpm.  She denies nausea, vomiting, diarrhea.  She endorses feeling of burning at the top of her chest when she coughs.  History reviewed. No pertinent past medical history.  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 No pertinent surgical history.  OB History   No obstetric history on file.      Home Medications    Prior to Admission medications   Medication Sig Start Date End Date Taking? Authorizing Provider  Calcium Carb-Cholecalciferol 600-400 MG-UNIT TABS Take 1 tablet by mouth daily.    [provider]  cyanocobalamin 1000 MCG tablet Take by mouth.    [provider]    Family History History reviewed. No pertinent family history.  Social History Social History   Tobacco Use   Smoking status: Never   Smokeless tobacco: Never  Substance Use Topics   Alcohol use: No   Drug use: No     Allergies   Penicillins   Review of Systems Review of Systems  Constitutional:  Positive for fever.  Respiratory:  Positive for cough.      Physical Exam Triage Vital Signs ED Triage Vitals  Enc Vitals Group     BP 09/20/22 1358 110/75     Pulse Rate 09/20/22 1358 (!) 104     Resp 09/20/22 1358 18     Temp 09/20/22 1358 99.4 F (37.4 C)     Temp src --      SpO2 09/20/22 1358 96 %     Weight --      Height --      Head Circumference --      Peak Flow --      Pain Score 09/20/22 1359 0     Pain Loc --      Pain Edu? --       Excl. in GC? --    No data found.  Updated Vital Signs BP 110/75   Pulse (!) 104   Temp 99.4 F (37.4 C)   Resp 18   SpO2 96%   Visual Acuity Right Eye Distance:   Left Eye Distance:   Bilateral Distance:    Right Eye Near:   Left Eye Near:    Bilateral Near:     Physical Exam Vitals reviewed.  Constitutional:      Appearance: She is ill-appearing.  Cardiovascular:     Rate and Rhythm: Normal rate and regular rhythm.     Pulses: Normal pulses.     Heart sounds: Normal heart sounds.  Pulmonary:     Effort: Pulmonary effort is normal.     Breath sounds: Normal breath sounds.  Skin:    General: Skin is warm and dry.  Neurological:     General: No focal deficit present.     Mental Status: She is alert and oriented to person, place, and time.  Psychiatric:  Mood and Affect: Mood normal.        Behavior: Behavior normal.      UC Treatments / Results  Labs (all labs ordered are listed, but only abnormal results are displayed) Labs Reviewed - No data to display  EKG   Radiology No results found.  Procedures Procedures (including critical care time)  Medications Ordered in UC Medications - No data to display  Initial Impression / Assessment and Plan / UC Course  I have reviewed the triage vital signs and the nursing notes.  Pertinent labs & imaging results that were available during my care of the patient were reviewed by me and considered in my medical decision making (see chart for details).   Patient is afebrile here without recent antipyretics. Satting well on room air. Overall is ill appearing, though well hydrated and without respiratory distress. Pulmonary exam is unremarkable.   Suspect viral process including influenza.  Results of respiratory swab are pending however she is outside the window for antiviral therapy for influenza A.  Will prescribe benzonatate for cough and recommend use of OTC medication for symptom control.  Final Clinical  Impressions(s) / UC Diagnoses   Final diagnoses:  None   Discharge Instructions   None    ED Prescriptions   None    PDMP not reviewed this encounter.   Charma Igo, [place] 09/20/22 [phone]

## 2022-09-20 NOTE — ED Triage Notes (Signed)
Pt. Presents to UC w/ c/o a cough, feer and chills for the past 3 days.

## 2023-01-26 ENCOUNTER — Other Ambulatory Visit: Payer: Self-pay | Admitting: Internal Medicine

## 2023-01-26 DIAGNOSIS — Z8249 Family history of ischemic heart disease and other diseases of the circulatory system: Secondary | ICD-10-CM

## 2023-01-26 DIAGNOSIS — Z0000 Encounter for general adult medical examination without abnormal findings: Secondary | ICD-10-CM

## 2023-01-26 DIAGNOSIS — E782 Mixed hyperlipidemia: Secondary | ICD-10-CM

## 2023-02-01 ENCOUNTER — Ambulatory Visit
Admission: RE | Admit: 2023-02-01 | Discharge: 2023-02-01 | Disposition: A | Discharge status: Home / Self Care | Payer: 59 | Source: Ambulatory Visit | Attending: Internal Medicine | Admitting: Internal Medicine

## 2023-02-01 DIAGNOSIS — E782 Mixed hyperlipidemia: Secondary | ICD-10-CM

## 2023-02-01 DIAGNOSIS — Z8249 Family history of ischemic heart disease and other diseases of the circulatory system: Secondary | ICD-10-CM | POA: Insufficient documentation

## 2023-02-01 DIAGNOSIS — Z0000 Encounter for general adult medical examination without abnormal findings: Secondary | ICD-10-CM

## 2023-04-29 ENCOUNTER — Ambulatory Visit: Payer: 59

## 2023-04-29 DIAGNOSIS — D123 Benign neoplasm of transverse colon: Secondary | ICD-10-CM

## 2023-04-29 DIAGNOSIS — Z1211 Encounter for screening for malignant neoplasm of colon: Secondary | ICD-10-CM
